# Patient Record
Sex: Female | Born: 2000
Health system: Western US, Academic
[De-identification: ages and names within clinical notes are randomized; demographics above are authoritative.]

## PROBLEM LIST (undated history)

## (undated) DIAGNOSIS — F329 Major depressive disorder, single episode, unspecified: Secondary | ICD-10-CM

## (undated) DIAGNOSIS — F32A Depression, unspecified: Secondary | ICD-10-CM

## (undated) DIAGNOSIS — F419 Anxiety disorder, unspecified: Secondary | ICD-10-CM

## (undated) HISTORY — DX: Major depressive disorder, single episode, unspecified: F32.9

## (undated) HISTORY — PX: NO PAST SURGERIES: SHX2092

## (undated) HISTORY — DX: Depression, unspecified: F32.A

## (undated) HISTORY — DX: Anxiety disorder, unspecified: F41.9

---
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
## 2021-01-21 ENCOUNTER — Emergency Department: Payer: Self-pay
